# Patient Record
Sex: Male | Born: 1980 | Race: White | Hispanic: No | Marital: Single | Smoking: Current every day smoker
Health system: Southern US, Community
[De-identification: ages and names within clinical notes are randomized; demographics above are authoritative.]

## PROBLEM LIST (undated history)

## (undated) HISTORY — PX: KNEE SURGERY: SHX244

## (undated) HISTORY — PX: WISDOM TOOTH EXTRACTION: SHX21

---

## 2013-12-30 ENCOUNTER — Emergency Department: Payer: Self-pay | Admitting: Internal Medicine

## 2014-02-13 ENCOUNTER — Emergency Department: Payer: Self-pay | Admitting: Emergency Medicine

## 2014-02-13 LAB — DRUG SCREEN, URINE
Amphetamines, Ur Screen: NEGATIVE (ref ?–1000)
BARBITURATES, UR SCREEN: NEGATIVE (ref ?–200)
Benzodiazepine, Ur Scrn: POSITIVE (ref ?–200)
Cannabinoid 50 Ng, Ur ~~LOC~~: NEGATIVE (ref ?–50)
Cocaine Metabolite,Ur ~~LOC~~: NEGATIVE (ref ?–300)
MDMA (Ecstasy)Ur Screen: NEGATIVE (ref ?–500)
Methadone, Ur Screen: NEGATIVE (ref ?–300)
OPIATE, UR SCREEN: POSITIVE (ref ?–300)
PHENCYCLIDINE (PCP) UR S: NEGATIVE (ref ?–25)
TRICYCLIC, UR SCREEN: NEGATIVE (ref ?–1000)

## 2014-02-13 LAB — COMPREHENSIVE METABOLIC PANEL
ALT: 30 U/L (ref 12–78)
ANION GAP: 7 (ref 7–16)
AST: 18 U/L (ref 15–37)
Albumin: 4.4 g/dL (ref 3.4–5.0)
Alkaline Phosphatase: 54 U/L
BUN: 20 mg/dL — ABNORMAL HIGH (ref 7–18)
Bilirubin,Total: 0.6 mg/dL (ref 0.2–1.0)
CHLORIDE: 107 mmol/L (ref 98–107)
CO2: 26 mmol/L (ref 21–32)
Calcium, Total: 9.1 mg/dL (ref 8.5–10.1)
Creatinine: 0.9 mg/dL (ref 0.60–1.30)
EGFR (African American): >60
EGFR (Non-African Amer.): >60
Glucose: 91 mg/dL (ref 65–99)
Osmolality: 282 (ref 275–301)
Potassium: 3.6 mmol/L (ref 3.5–5.1)
Sodium: 140 mmol/L (ref 136–145)
TOTAL PROTEIN: 7.4 g/dL (ref 6.4–8.2)

## 2014-02-13 LAB — ETHANOL
ETHANOL %: 0.004 % (ref 0.000–0.080)
ETHANOL LVL: 4 mg/dL

## 2014-02-13 LAB — CBC
HCT: 42.2 % (ref 40.0–52.0)
HGB: 14.5 g/dL (ref 13.0–18.0)
MCH: 29.8 pg (ref 26.0–34.0)
MCHC: 34.3 g/dL (ref 32.0–36.0)
MCV: 87 fL (ref 80–100)
PLATELETS: 306 10*3/uL (ref 150–440)
RBC: 4.87 10*6/uL (ref 4.40–5.90)
RDW: 13.9 % (ref 11.5–14.5)
WBC: 11.4 10*3/uL — AB (ref 3.8–10.6)

## 2014-02-13 LAB — CK TOTAL AND CKMB (NOT AT ARMC)
CK, Total: 317 U/L — ABNORMAL HIGH
CK-MB: 2.7 ng/mL (ref 0.5–3.6)

## 2014-11-20 IMAGING — CT CT HEAD WITHOUT CONTRAST
2 series · 14 of 30 positions shown, 16 images · non-contrast
Comparison: none

[Series 2: head wo · axial · 0.42mm/px · z∈[+1160,+1264]mm · 6 of 31 slices shown, 8 images]
[im 5/31  brain]
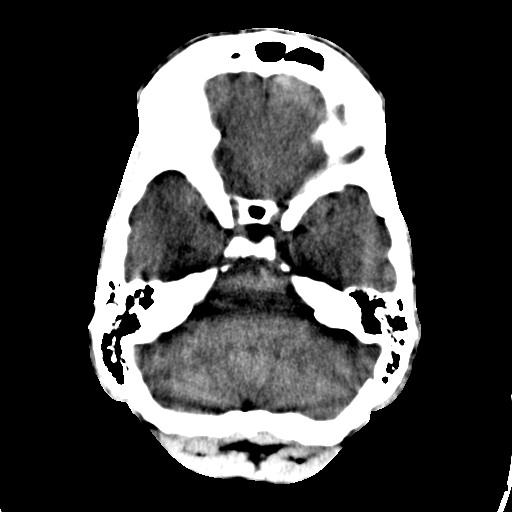
[im 5/31  bone]
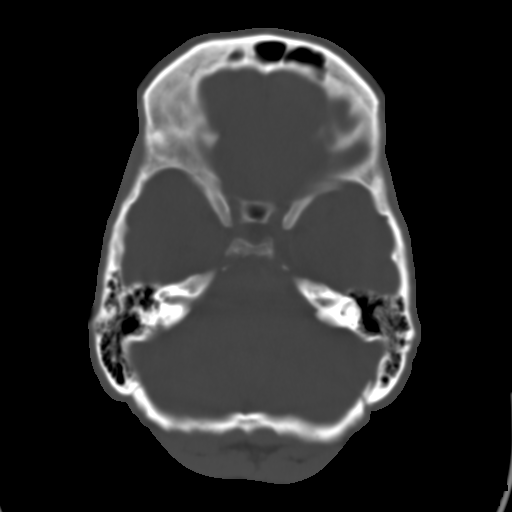
[im 9/31  brain]
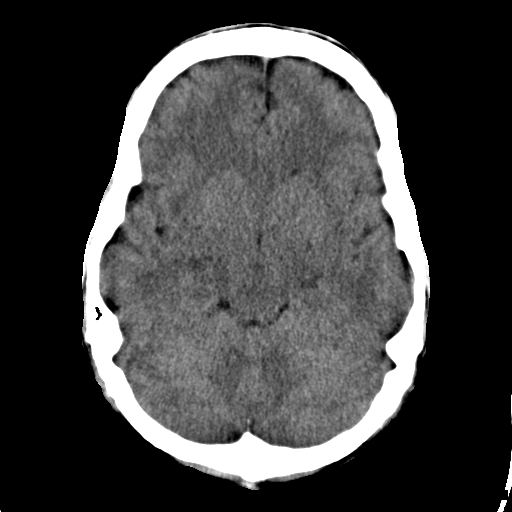
[im 13/31  brain]
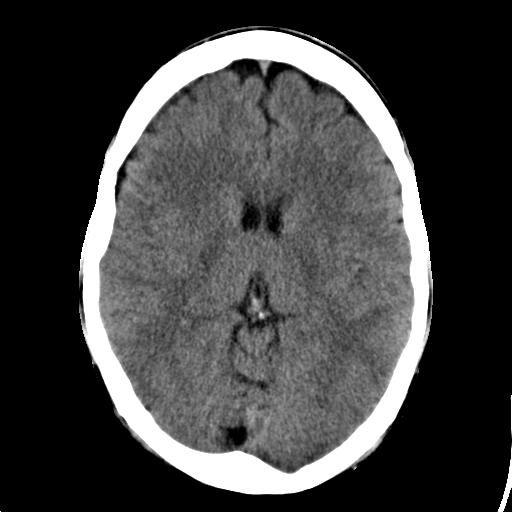
[im 18/31  brain]
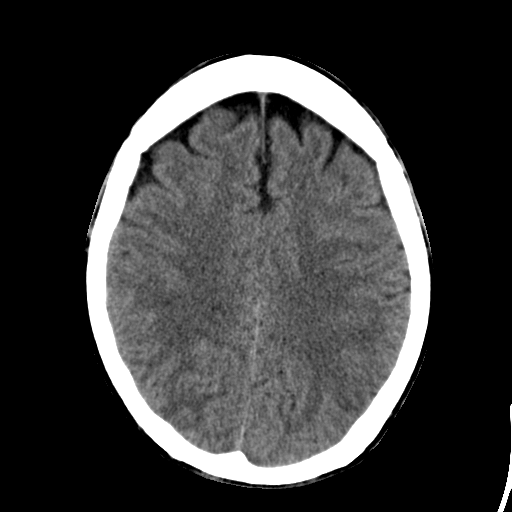
[im 22/31  brain]
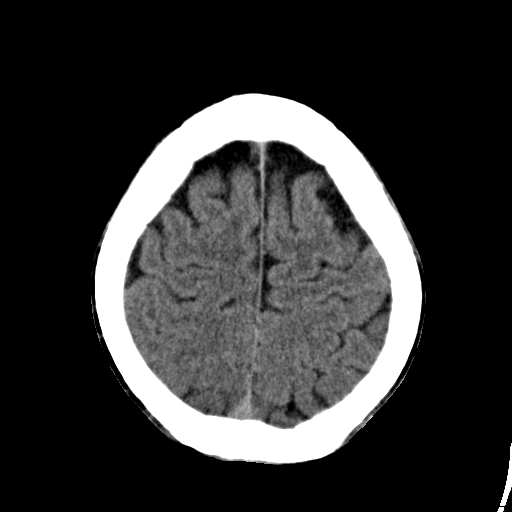
[im 22/31  bone]
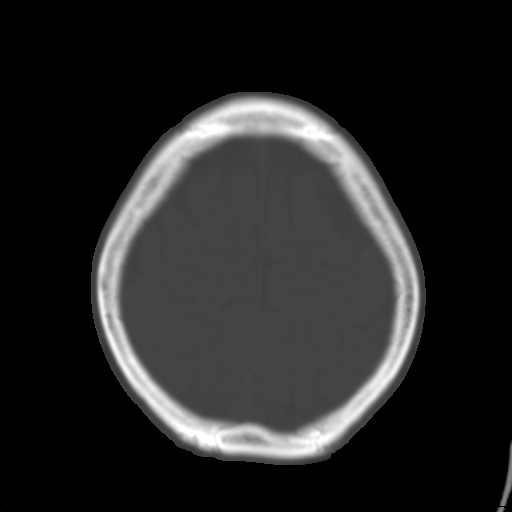
[im 26/31  brain]
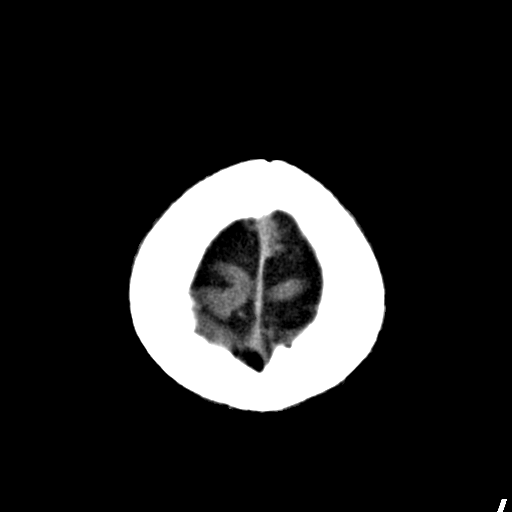

[Series 3: head bone · axial · 0.42mm/px · z∈[+1147,+1283]mm · 8 of 84 slices shown]
[im 8/84  bone]
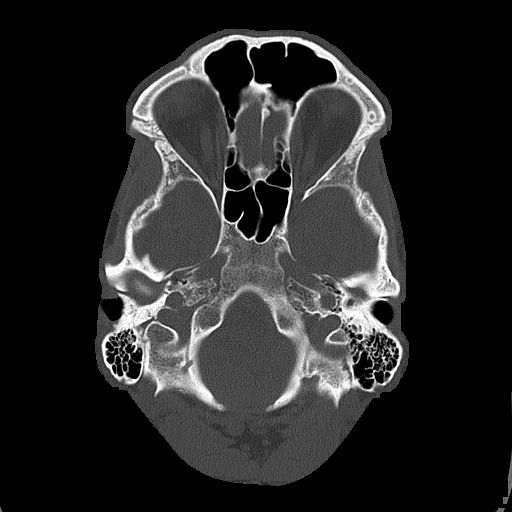
[im 16/84  bone]
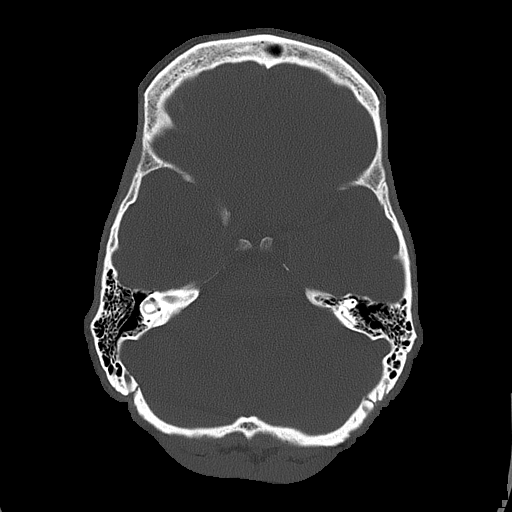
[im 28/84  bone]
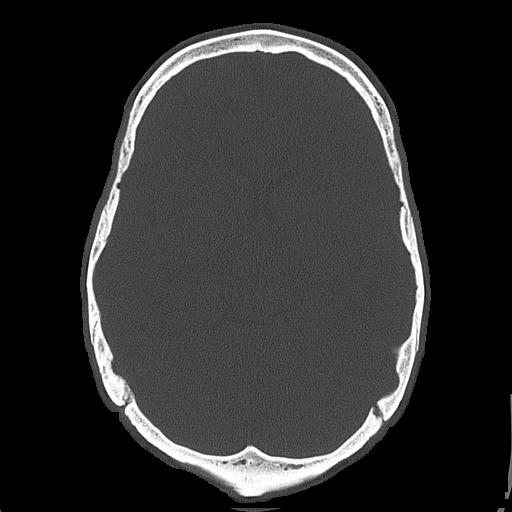
[im 36/84  bone]
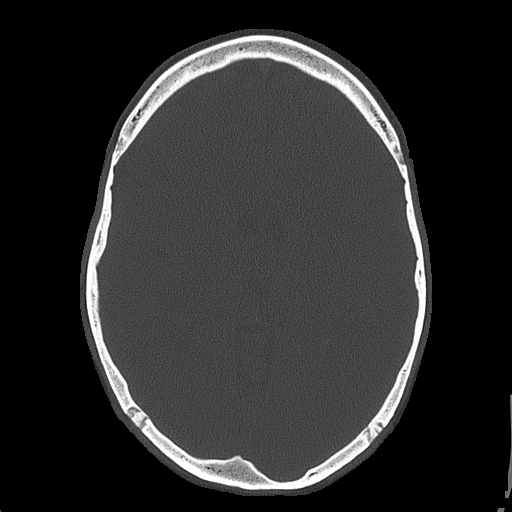
[im 48/84  bone]
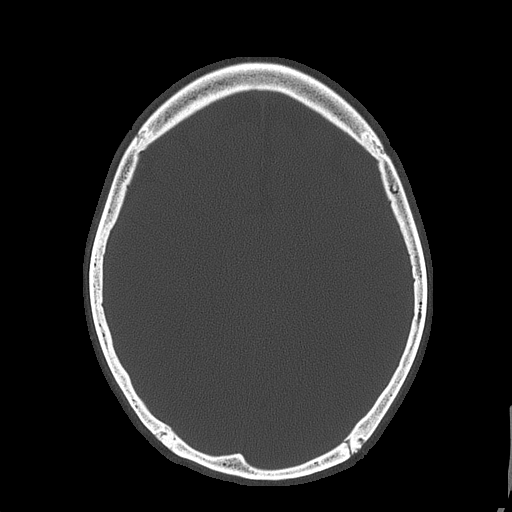
[im 56/84  bone]
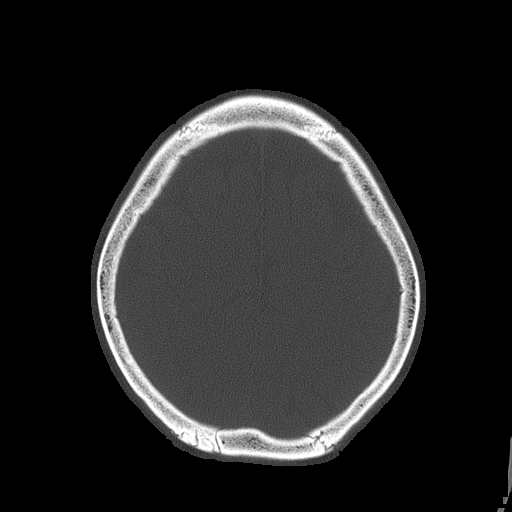
[im 68/84  bone]
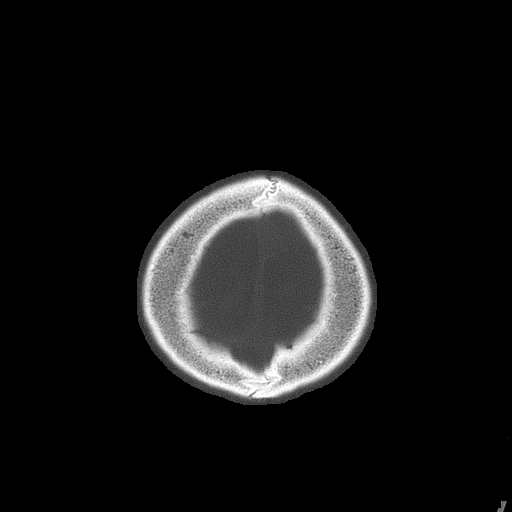
[im 76/84  bone]
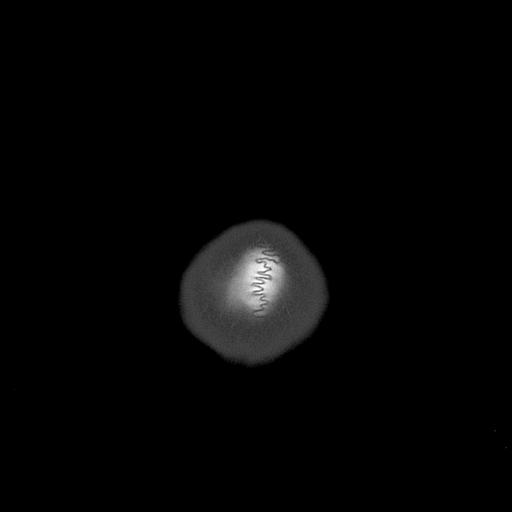

[14 of 30 positions shown; findings below may reference images not displayed]

CLINICAL DATA
Confused

EXAM
CT HEAD WITHOUT CONTRAST

TECHNIQUE
Contiguous axial images were obtained from the base of the skull
through the vertex without intravenous contrast.

COMPARISON
None.

FINDINGS
There is no evidence of mass effect, midline shift or extra-axial
fluid collections. There is no evidence of a space-occupying lesion
or intracranial hemorrhage. There is no evidence of a cortical-based
area of acute infarction.

The ventricles and sulci are appropriate for the patient's age. The
basal cisterns are patent.

Visualized portions of the orbits are unremarkable. The visualized
portions of the paranasal sinuses and mastoid air cells are
unremarkable.

The osseous structures are unremarkable.

IMPRESSION
No acute intracranial pathology.

SIGNATURE
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
## 2023-07-09 ENCOUNTER — Ambulatory Visit
Admission: EM | Admit: 2023-07-09 | Discharge: 2023-07-09 | Disposition: A | Discharge status: Home / Self Care | Payer: Medicaid Other | Attending: Physician Assistant | Admitting: Physician Assistant

## 2023-07-09 ENCOUNTER — Encounter: Payer: Self-pay | Admitting: Emergency Medicine

## 2023-07-09 DIAGNOSIS — K0889 Other specified disorders of teeth and supporting structures: Secondary | ICD-10-CM | POA: Diagnosis not present

## 2023-07-09 DIAGNOSIS — K029 Dental caries, unspecified: Secondary | ICD-10-CM | POA: Diagnosis not present

## 2023-07-09 MED ORDER — HYDROCODONE-ACETAMINOPHEN 5-325 MG PO TABS: 1.0000 | ORAL_TABLET | Freq: Two times a day (BID) | ORAL | 0 refills | Status: DC | PRN

## 2023-07-09 MED ORDER — HYDROCODONE-ACETAMINOPHEN 5-325 MG PO TABS: 1.0000 | ORAL_TABLET | Freq: Two times a day (BID) | ORAL | 0 refills | Status: AC | PRN

## 2023-07-09 MED ORDER — AMOXICILLIN-POT CLAVULANATE 875-125 MG PO TABS
1.0000 | ORAL_TABLET | Freq: Two times a day (BID) | ORAL | 0 refills | Status: AC
Start: 2023-07-09 — End: 2023-07-16

## 2023-07-09 NOTE — Discharge Instructions (Addendum)
-  I sent antibiotics to the pharmacy as well as a couple of pills for pain relief.  You should start to improve in a couple days but you will need to follow-up with dentist.

## 2023-07-09 NOTE — ED Provider Notes (Signed)
[facility]    CSN: [id number] Arrival date & time: 07/09/23  0927      History   Chief Complaint Chief Complaint  Patient presents with   Dental Pain    HPI Trevor Burgess is a 42 y.o. male presenting for dental pain of a tooth of the right upper side since yesterday, worsening today. He reports long history of poor dental hygiene and care. Does not have a dentist but says he knows he needs to have a lot of teeth pulled.  He denies any associated swelling to his face.  He has tried Tylenol without pain relief.  States he could not sleep last night due to the pain.  He cannot take NSAIDs due to his history of Crohn's and risk for GI bleed.  No other complaints.  HPI  History reviewed. No pertinent past medical history.  There are no problems to display for this patient.   Past Surgical History:  Procedure Laterality Date   KNEE SURGERY Left    WISDOM TOOTH EXTRACTION         Home Medications    Prior to Admission medications   Medication Sig Start Date End Date Taking? Authorizing Provider  amoxicillin-clavulanate (AUGMENTIN) 875-125 MG tablet Take 1 tablet by mouth every 12 (twelve) hours for 7 days. 07/09/23 07/16/23 Yes Shirlee Latch, PA-C  HYDROcodone-acetaminophen (NORCO/VICODIN) 5-325 MG tablet Take 1 tablet by mouth every 12 (twelve) hours as needed for up to 3 days for severe pain. 07/09/23 07/12/23 Yes Shirlee Latch, PA-C    Family History History reviewed. No pertinent family history.  Social History Social History   Tobacco Use   Smoking status: Every Day    Types: Cigarettes   Smokeless tobacco: Never  Vaping Use   Vaping status: Never Used  Substance Use Topics   Alcohol use: Not Currently   Drug use: Never     Allergies   Banana, Charentais melon (french melon), Ketorolac, Nsaids, and Tramadol   Review of Systems Review of Systems  Constitutional:  Negative for fatigue and fever.  HENT:  Positive for dental problem. Negative  for ear pain, facial swelling, rhinorrhea and sore throat.   Neurological:  Negative for weakness.  Hematological:  Negative for adenopathy.  Psychiatric/Behavioral:  Positive for sleep disturbance.      Physical Exam Triage Vital Signs ED Triage Vitals  Encounter Vitals Group     BP 07/09/23 0941 (!) 160/105     Systolic BP Percentile --      Diastolic BP Percentile --      Pulse Rate 07/09/23 0941 73     Resp 07/09/23 0941 15     Temp 07/09/23 0941 98.4 F (36.9 C)     Temp Source 07/09/23 0941 Oral     SpO2 07/09/23 0941 97 %     Weight 07/09/23 0937 220 lb (99.8 kg)     Height 07/09/23 0937 5\' 9"  (1.753 m)     Head Circumference --      Peak Flow --      Pain Score 07/09/23 0937 6     Pain Loc --      Pain Education --      Exclude from Growth Chart --    No data found.  Updated Vital Signs BP (!) 160/105 (BP Location: Right Arm)   Pulse 73   Temp 98.4 F (36.9 C) (Oral)   Resp 15   Ht 5\' 9"  (1.753 m)   Wt  220 lb (99.8 kg)   SpO2 97%   BMI 32.49 kg/m      Physical Exam Vitals and nursing note reviewed.  Constitutional:      General: He is not in acute distress.    Appearance: Normal appearance. He is well-developed. He is not ill-appearing.  HENT:     Head: Normocephalic and atraumatic.     Mouth/Throat:     Mouth: Mucous membranes are moist.     Dentition: Abnormal dentition. Dental tenderness and dental caries present.     Pharynx: Oropharynx is clear.      Comments: #7 right lateral incisor severely decayed with tenderness and erythema surrounding. No abscess. Eyes:     Conjunctiva/sclera: Conjunctivae normal.  Cardiovascular:     Rate and Rhythm: Normal rate.  Pulmonary:     Effort: Pulmonary effort is normal. No respiratory distress.  Musculoskeletal:     Cervical back: Neck supple.  Skin:    General: Skin is warm and dry.     Capillary Refill: Capillary refill takes less than 2 seconds.  Neurological:     General: No focal deficit  present.     Mental Status: He is alert. Mental status is at baseline.     Motor: No weakness.     Gait: Gait normal.  Psychiatric:        Mood and Affect: Mood normal.      UC Treatments / Results  Labs (all labs ordered are listed, but only abnormal results are displayed) Labs Reviewed - No data to display  EKG   Radiology No results found.  Procedures Procedures (including critical care time)  Medications Ordered in UC Medications - No data to display  Initial Impression / Assessment and Plan / UC Course  I have reviewed the triage vital signs and the nursing notes.  Pertinent labs & imaging results that were available during my care of the patient were reviewed by me and considered in my medical decision making (see chart for details).   42 year old male presents for pain of the right lateral incisor since yesterday.  Patient reports a long history of poor dental hygiene and care on his part.  Does not have a dentist.  Has been taking Tylenol without relief.  On exam he has significant dental caries and dental decay throughout all teeth.  The right lateral incisor is severely decayed with tenderness and surrounding erythema without abscess.  Will treat at this time for infection with Augmentin.  Provided him with  #6 tablets hydrocodone as needed for severe pain.  Advised him to follow-up with dentistry.  Supportive care.  Follow-up here as needed.   Final Clinical Impressions(s) / UC Diagnoses   Final diagnoses:  Pain, dental  Dental caries     Discharge Instructions      -I sent antibiotics to the pharmacy as well as a couple of pills for pain relief.  You should start to improve in a couple days but you will need to follow-up with dentist.     ED Prescriptions     Medication Sig Dispense Auth. Provider   amoxicillin-clavulanate (AUGMENTIN) 875-125 MG tablet Take 1 tablet by mouth every 12 (twelve) hours for 7 days. 14 tablet Eusebio Friendly B, PA-C    HYDROcodone-acetaminophen (NORCO/VICODIN) 5-325 MG tablet Take 1 tablet by mouth every 12 (twelve) hours as needed for up to 3 days for severe pain. 6 tablet Shirlee Latch, PA-C      I have reviewed the PDMP during  this encounter.   Shirlee Latch, PA-C 07/09/23 1024

## 2023-07-09 NOTE — ED Triage Notes (Signed)
Patient c/o right upper tooth pain that started yesterday and radiates to his right facial cheek area. Patient denies recent injury.  Patient denies fevers.

## 2023-07-15 ENCOUNTER — Other Ambulatory Visit: Payer: Self-pay

## 2023-07-15 ENCOUNTER — Emergency Department
Admission: EM | Admit: 2023-07-15 | Discharge: 2023-07-15 | Disposition: A | Discharge status: Home / Self Care | Payer: Medicaid Other | Source: Home / Self Care | Attending: Emergency Medicine | Admitting: Emergency Medicine

## 2023-07-15 DIAGNOSIS — K0889 Other specified disorders of teeth and supporting structures: Secondary | ICD-10-CM

## 2023-07-15 DIAGNOSIS — Z98818 Other dental procedure status: Secondary | ICD-10-CM | POA: Insufficient documentation

## 2023-07-15 DIAGNOSIS — F17210 Nicotine dependence, cigarettes, uncomplicated: Secondary | ICD-10-CM | POA: Diagnosis not present

## 2023-07-15 DIAGNOSIS — K08409 Partial loss of teeth, unspecified cause, unspecified class: Secondary | ICD-10-CM

## 2023-07-15 MED ORDER — LIDOCAINE VISCOUS HCL 2 % MT SOLN
15.0000 mL | Freq: Once | OROMUCOSAL | Status: AC
  Administered 2023-07-15: 15 mL via OROMUCOSAL
  Filled 2023-07-15: qty 15

## 2023-07-15 MED ORDER — OXYCODONE-ACETAMINOPHEN 5-325 MG PO TABS
1.0000 | ORAL_TABLET | Freq: Once | ORAL | Status: AC
  Administered 2023-07-15: 1 via ORAL
  Filled 2023-07-15: qty 1

## 2023-07-15 MED ORDER — OXYCODONE-ACETAMINOPHEN 5-325 MG PO TABS: 1.0000 | ORAL_TABLET | ORAL | 0 refills | Status: AC | PRN

## 2023-07-15 NOTE — Discharge Instructions (Signed)
You may take Percocet as needed for pain.  Return to the ER for worsening symptoms, persistent vomiting, facial swelling, fever or other concerns.

## 2023-07-15 NOTE — ED Triage Notes (Signed)
Pt reports he had 2 teeth pulled on the R side on Tuesday. Reports tonight had onset of dental pain and states "my mouth is on fire." Pt is ambulatory to triage. Alert and oriented following commands. Breathing unlabored speaking in full sentences. Symmetric chest rise and fall.

## 2023-07-15 NOTE — ED Provider Notes (Signed)
[facility] Provider Note    Event Date/Time   First MD Initiated Contact with Patient 07/15/23 [phone]     (approximate)   History   Dental Pain   HPI  Trevor Burgess is a 42 y.o. male who presents to the ED from home with a chief complaint of dental pain.  Patient had 2 teeth extracted 2 days ago and reports pain worsened today.  States it feels like his mouth is on fire particularly after he smoked a cigarette.  Denies fever/chills, facial swelling, nausea/vomiting or dizziness.     Past Medical History  History reviewed. No pertinent past medical history.   Active Problem List  There are no problems to display for this patient.    Past Surgical History   Past Surgical History:  Procedure Laterality Date   KNEE SURGERY Left    WISDOM TOOTH EXTRACTION       Home Medications   Prior to Admission medications   Medication Sig Start Date End Date Taking? Authorizing Provider  oxyCODONE-acetaminophen (PERCOCET/ROXICET) 5-325 MG tablet Take 1 tablet by mouth every 4 (four) hours as needed for severe pain. 07/15/23  Yes Irean Hong, MD  amoxicillin-clavulanate (AUGMENTIN) 875-125 MG tablet Take 1 tablet by mouth every 12 (twelve) hours for 7 days. 07/09/23 07/16/23  Shirlee Latch, PA-C     Allergies  Banana, Charentais melon (french melon), Ketorolac, Nsaids, and Tramadol   Family History  History reviewed. No pertinent family history.   Physical Exam  Triage Vital Signs: ED Triage Vitals  Encounter Vitals Group     BP 07/15/23 0043 (!) 158/118     Systolic BP Percentile --      Diastolic BP Percentile --      Pulse Rate 07/15/23 0042 77     Resp 07/15/23 0042 20     Temp 07/15/23 0042 98.5 F (36.9 C)     Temp Source 07/15/23 0042 Oral     SpO2 07/15/23 0042 98 %     Weight 07/15/23 0042 215 lb (97.5 kg)     Height 07/15/23 0042 5\' 9"  (1.753 m)     Head Circumference --      Peak Flow --      Pain Score 07/15/23 0042 10     Pain  Loc --      Pain Education --      Exclude from Growth Chart --     Updated Vital Signs: BP (!) 158/118   Pulse 77   Temp 98.5 F (36.9 C) (Oral)   Resp 20   Ht 5\' 9"  (1.753 m)   Wt 97.5 kg   SpO2 98%   BMI 31.75 kg/m    General: Awake, mild distress.  CV:  Good peripheral perfusion.  Resp:  Normal effort. Abd:  No distention.  Other:  Widespread dental caries and poor dentition.  Right upper incisor status post extraction; no dry pocket noted.  No intra or extraoral swelling.   ED Results / Procedures / Treatments  Labs (all labs ordered are listed, but only abnormal results are displayed) Labs Reviewed - No data to display   EKG  None   RADIOLOGY None   Official radiology report(s): No results found.   PROCEDURES:  Critical Care performed: No  Procedures   MEDICATIONS ORDERED IN ED: Medications  lidocaine (XYLOCAINE) 2 % viscous mouth solution 15 mL (has no administration in time range)  oxyCODONE-acetaminophen (PERCOCET/ROXICET) 5-325 MG per tablet 1 tablet (  has no administration in time range)     IMPRESSION / MDM / ASSESSMENT AND PLAN / ED COURSE  I reviewed the triage vital signs and the nursing notes.                             42 year old male presenting with post dental extraction pain.  Administer lidocaine rinse, Percocet and he will call his dentist in the morning for follow-up.  Strict return precautions given.  Patient verbalizes understanding and agrees with plan of care.  Patient's presentation is most consistent with acute, uncomplicated illness.   FINAL CLINICAL IMPRESSION(S) / ED DIAGNOSES   Final diagnoses:  Pain, dental  Status post tooth extraction     Rx / DC Orders   ED Discharge Orders          Ordered    oxyCODONE-acetaminophen (PERCOCET/ROXICET) 5-325 MG tablet  Every 4 hours PRN        07/15/23 0059             Note:  This document was prepared using Dragon voice recognition software and may include  unintentional dictation errors.   Irean Hong, MD 07/15/23 [phone]

## 2023-07-19 ENCOUNTER — Other Ambulatory Visit: Payer: Self-pay

## 2023-07-19 ENCOUNTER — Emergency Department
Admission: EM | Admit: 2023-07-19 | Discharge: 2023-07-19 | Disposition: A | Discharge status: Home / Self Care | Payer: Medicaid Other | Attending: Emergency Medicine | Admitting: Emergency Medicine

## 2023-07-19 DIAGNOSIS — F410 Panic disorder [episodic paroxysmal anxiety] without agoraphobia: Secondary | ICD-10-CM | POA: Insufficient documentation

## 2023-07-19 MED ORDER — LORAZEPAM 1 MG PO TABS: 1.0000 mg | ORAL_TABLET | Freq: Two times a day (BID) | ORAL | 0 refills | Status: AC

## 2023-07-19 MED ORDER — LORAZEPAM 1 MG PO TABS
1.0000 mg | ORAL_TABLET | Freq: Once | ORAL | Status: AC
  Administered 2023-07-19: 1 mg via ORAL
  Filled 2023-07-19: qty 1

## 2023-07-19 NOTE — ED Notes (Signed)
Pt verbalizes understanding of discharge instructions. Opportunity for questioning and answers were provided. Pt discharged from ED to home in [place].
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
## 2023-07-19 NOTE — ED Notes (Signed)
Pt reports anxiety.  Denies SI or HI
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
## 2023-07-20 DIAGNOSIS — K0889 Other specified disorders of teeth and supporting structures: Secondary | ICD-10-CM | POA: Diagnosis present

## 2023-07-20 DIAGNOSIS — K029 Dental caries, unspecified: Secondary | ICD-10-CM | POA: Insufficient documentation

## 2023-07-20 DIAGNOSIS — K047 Periapical abscess without sinus: Secondary | ICD-10-CM | POA: Diagnosis not present

## 2023-07-21 ENCOUNTER — Emergency Department
Admission: EM | Admit: 2023-07-21 | Discharge: 2023-07-21 | Disposition: A | Discharge status: Home / Self Care | Payer: Medicaid Other | Attending: Emergency Medicine | Admitting: Emergency Medicine

## 2023-07-21 ENCOUNTER — Other Ambulatory Visit: Payer: Self-pay

## 2023-07-21 DIAGNOSIS — K0889 Other specified disorders of teeth and supporting structures: Secondary | ICD-10-CM

## 2023-07-21 DIAGNOSIS — K047 Periapical abscess without sinus: Secondary | ICD-10-CM

## 2023-07-21 DIAGNOSIS — K029 Dental caries, unspecified: Secondary | ICD-10-CM

## 2023-07-21 MED ORDER — AMOXICILLIN 500 MG PO TABS: 500.0000 mg | ORAL_TABLET | Freq: Two times a day (BID) | ORAL | 0 refills | Status: AC

## 2023-07-21 MED ORDER — OXYCODONE HCL 5 MG PO TABS: 5.0000 mg | ORAL_TABLET | Freq: Three times a day (TID) | ORAL | 0 refills | Status: DC | PRN

## 2023-07-21 MED ORDER — ACETAMINOPHEN 500 MG PO TABS
1000.0000 mg | ORAL_TABLET | Freq: Once | ORAL | Status: AC
  Administered 2023-07-21: 1000 mg via ORAL
  Filled 2023-07-21: qty 2

## 2023-07-21 MED ORDER — AMOXICILLIN 500 MG PO CAPS
500.0000 mg | ORAL_CAPSULE | Freq: Once | ORAL | Status: AC
  Administered 2023-07-21: 500 mg via ORAL
  Filled 2023-07-21: qty 1

## 2023-07-21 MED ORDER — OXYCODONE HCL 5 MG PO TABS
5.0000 mg | ORAL_TABLET | Freq: Once | ORAL | Status: AC
  Administered 2023-07-21: 5 mg via ORAL
  Filled 2023-07-21: qty 1

## 2023-07-21 MED ORDER — ONDANSETRON 4 MG PO TBDP
4.0000 mg | ORAL_TABLET | Freq: Once | ORAL | Status: AC
  Administered 2023-07-21: 4 mg via ORAL
  Filled 2023-07-21: qty 1

## 2023-07-21 NOTE — ED Triage Notes (Signed)
Reports dental pain that onset today. Dental caries and broken teeth noted. Pt awaiting full mouth extraction. Reports pain is L lower and states he can feel the tooth moving and that he found pieces of teeth on his gumline. Pt ambulatory to triage. Alert and oriented following commands. Breathing unlabored speaking in full sentences.

## 2023-07-21 NOTE — ED Provider Notes (Signed)
[facility] Provider Note    Event Date/Time   First MD Initiated Contact with Patient 07/21/23 0240     (approximate)   History   Dental Pain   HPI  Trevor Burgess is a 42 y.o. male who presents to the ED for evaluation of Dental Pain   Patient presents with acute on chronic dental pain.  Reports he needs all of his teeth pulled.  Reports pain to the left mandibular molar without swelling or fevers   Physical Exam   Triage Vital Signs: ED Triage Vitals  Encounter Vitals Group     BP 07/21/23 0013 (!) 172/126     Systolic BP Percentile --      Diastolic BP Percentile --      Pulse Rate 07/21/23 0013 98     Resp 07/21/23 0013 18     Temp 07/21/23 0013 98.4 F (36.9 C)     Temp Source 07/21/23 0013 Oral     SpO2 07/21/23 0013 97 %     Weight 07/21/23 0014 213 lb (96.6 kg)     Height 07/21/23 0014 5\' 9"  (1.753 m)     Head Circumference --      Peak Flow --      Pain Score 07/21/23 0013 8     Pain Loc --      Pain Education --      Exclude from Growth Chart --     Most recent vital signs: Vitals:   07/21/23 0013 07/21/23 0415  BP: (!) 172/126 (!) 152/90  Pulse: 98 90  Resp: 18 16  Temp: 98.4 F (36.9 C)   SpO2: 97% 97%    General: Awake, no distress.  CV:  Good peripheral perfusion.  Resp:  Normal effort.  Abd:  No distention.  MSK:  No deformity noted.  Neuro:  No focal deficits appreciated. Other:  Poor dentition throughout.  No evidence of abscess.   ED Results / Procedures / Treatments   Labs (all labs ordered are listed, but only abnormal results are displayed) Labs Reviewed - No data to display  EKG   RADIOLOGY   Official radiology report(s): No results found.  PROCEDURES and INTERVENTIONS:  Procedures  Medications  ondansetron (ZOFRAN-ODT) disintegrating tablet 4 mg (4 mg Oral Given 07/21/23 0019)  oxyCODONE (Oxy IR/ROXICODONE) immediate release tablet 5 mg (5 mg Oral Given 07/21/23 0412)  acetaminophen  (TYLENOL) tablet 1,000 mg (1,000 mg Oral Given 07/21/23 0412)  amoxicillin (AMOXIL) capsule 500 mg (500 mg Oral Given 07/21/23 0413)     IMPRESSION / MDM / ASSESSMENT AND PLAN / ED COURSE  I reviewed the triage vital signs and the nursing notes.  Differential diagnosis includes, but is not limited to, abscess, dental infection, fractured tooth  Patient presents with acute on chronic dental pain suitable for outpatient management with dental follow-up.  No signs of upper airway obstruction or abscess.  We will cover for infection with a few days of antibiotics.  Provided information for dental follow-up.  Discussed return precautions.       FINAL CLINICAL IMPRESSION(S) / ED DIAGNOSES   Final diagnoses:  Pain, dental  Dental caries  Dental infection  Pain due to dental caries     Rx / DC Orders   ED Discharge Orders          Ordered    amoxicillin (AMOXIL) 500 MG tablet  2 times daily        07/21/23 0405  oxyCODONE (ROXICODONE) 5 MG immediate release tablet  Every 8 hours PRN        07/21/23 0405             Note:  This document was prepared using Dragon voice recognition software and may include unintentional dictation errors.   Delton Prairie, MD 07/21/23 [phone]

## 2023-07-21 NOTE — ED Triage Notes (Signed)
Pt reports not supposed to have NSAIDS. States took 4 BC powders pta without relief. Pt now nauseous and vomiting in triage.

## 2023-07-21 NOTE — ED Notes (Signed)
Report from Frederika, rn

## 2023-07-21 NOTE — Discharge Instructions (Addendum)
Use Tylenol for pain and fevers.  Up to 1000 mg per dose, up to 4 times per day.  Do not take more than 4000 mg of Tylenol/acetaminophen within 24 hours..  Amoxicillin antibiotics twice daily for a week to treat possible infection

## 2023-07-22 ENCOUNTER — Telehealth: Payer: Self-pay

## 2023-07-22 NOTE — Telephone Encounter (Signed)
Per Dr. Elna Breslow, pt had recent visit to ED , I do not recommend him waiting to see me.  Patient needs to be scheduled with some one who can see him sooner.  Also with his needs for lorazepam,please let him know controlled substance policy.  Please refer to someone else.   Tc on 07-22-23 @ 11:12 left message explaining.  Pt was also left information for [facility]. And was advised to contact insurance company to find in network providers in the area.

## 2023-07-22 NOTE — Telephone Encounter (Signed)
-----   Message from Jomarie Longs sent at 07/22/2023  8:27 AM EDT ----- Regarding: RE: referral review Patient with recent visit to ED , I do not recommend him waiting to see me. Patient needs to be scheduled with some one who can see him sooner. Also with his needs for lorazepam,please let him know controlled substance policy. Please refer to someone else. ----- Message ----- From: Elvina Mattes, CMA Sent: 07/21/2023  11:26 AM EDT To: Jomarie Longs, MD Subject: referral review                                Please review referral

## 2023-08-17 ENCOUNTER — Other Ambulatory Visit: Payer: Self-pay

## 2023-08-17 DIAGNOSIS — K0889 Other specified disorders of teeth and supporting structures: Secondary | ICD-10-CM | POA: Insufficient documentation

## 2023-08-17 NOTE — ED Triage Notes (Signed)
Pt to ED via POV c/o dental pain. Pt says he had 9 teeth pulled today. Pt still having pain on left side. Pt took tylenol with no relief.

## 2023-08-18 ENCOUNTER — Emergency Department
Admission: EM | Admit: 2023-08-18 | Discharge: 2023-08-18 | Disposition: A | Discharge status: Home / Self Care | Payer: Medicaid Other | Attending: Emergency Medicine | Admitting: Emergency Medicine

## 2023-08-18 DIAGNOSIS — K0889 Other specified disorders of teeth and supporting structures: Secondary | ICD-10-CM

## 2023-08-18 MED ORDER — OXYCODONE HCL 5 MG PO TABS
5.0000 mg | ORAL_TABLET | Freq: Once | ORAL | Status: AC
  Administered 2023-08-18: 5 mg via ORAL
  Filled 2023-08-18: qty 1

## 2023-08-18 MED ORDER — OXYCODONE HCL 5 MG PO TABS: 5.0000 mg | ORAL_TABLET | Freq: Three times a day (TID) | ORAL | 0 refills | Status: DC | PRN

## 2023-08-18 MED ORDER — ACETAMINOPHEN 500 MG PO TABS
1000.0000 mg | ORAL_TABLET | Freq: Once | ORAL | Status: AC
  Administered 2023-08-18: 1000 mg via ORAL
  Filled 2023-08-18: qty 2

## 2023-08-18 MED ORDER — KETOROLAC TROMETHAMINE 30 MG/ML IJ SOLN
30.0000 mg | Freq: Once | INTRAMUSCULAR | Status: AC
  Administered 2023-08-18: 30 mg via INTRAMUSCULAR
  Filled 2023-08-18: qty 1

## 2023-08-18 NOTE — Discharge Instructions (Addendum)
Please take Tylenol and ibuprofen/Advil for your pain.  It is safe to take them together, or to alternate them every few hours.  Take up to 1000mg  of Tylenol at a time, up to 4 times per day.  Do not take more than 4000 mg of Tylenol in 24 hours.  For ibuprofen, take 400-600 mg, 3 - 4 times per day.  Oxycodone as needed for more severe / breakthrough pain

## 2023-08-18 NOTE — ED Provider Notes (Signed)
[facility] Provider Note    Event Date/Time   First MD Initiated Contact with Patient 08/18/23 0021     (approximate)   History   Dental Pain   HPI  Trevor Burgess is a 42 y.o. male who presents to the ED for evaluation of Dental Pain   Patient presents with maxillary dental pain after an extraction performed today.  Reports 9 maxillary teeth pulled and he did not receive any pain medications.  Has taken Tylenol without significant relief.  No fevers, swelling, difficulty breathing or swallowing   Physical Exam   Triage Vital Signs: ED Triage Vitals  Encounter Vitals Group     BP 08/17/23 2233 131/89     Systolic BP Percentile --      Diastolic BP Percentile --      Pulse Rate 08/17/23 2233 92     Resp 08/17/23 2233 20     Temp 08/17/23 2233 98.9 F (37.2 C)     Temp Source 08/17/23 2233 Oral     SpO2 08/17/23 2233 98 %     Weight 08/17/23 2231 215 lb (97.5 kg)     Height 08/17/23 2231 5\' 9"  (1.753 m)     Head Circumference --      Peak Flow --      Pain Score 08/17/23 2230 10     Pain Loc --      Pain Education --      Exclude from Growth Chart --     Most recent vital signs: Vitals:   08/17/23 2233  BP: 131/89  Pulse: 92  Resp: 20  Temp: 98.9 F (37.2 C)  SpO2: 98%    General: Awake, no distress.  CV:  Good peripheral perfusion.  Resp:  Normal effort.  Abd:  No distention.  MSK:  No deformity noted.  Neuro:  No focal deficits appreciated. Other:  Very poor dentition, multiple areas of sutures of extraction sites to maxillary gumline   ED Results / Procedures / Treatments   Labs (all labs ordered are listed, but only abnormal results are displayed) Labs Reviewed - No data to display  EKG   RADIOLOGY   Official radiology report(s): No results found.  PROCEDURES and INTERVENTIONS:  Procedures  Medications  acetaminophen (TYLENOL) tablet 1,000 mg (has no administration in time range)  ketorolac (TORADOL) 30  MG/ML injection 30 mg (has no administration in time range)  oxyCODONE (Oxy IR/ROXICODONE) immediate release tablet 5 mg (has no administration in time range)     IMPRESSION / MDM / ASSESSMENT AND PLAN / ED COURSE  I reviewed the triage vital signs and the nursing notes.  Differential diagnosis includes, but is not limited to, postoperative pain, infection, abscess, upper airway obstruction  Patient presents with postop pain after multiple tooth extractions.  Appropriate exam.  Suitable for outpatient management.  Will provide analgesia here and a few doses of oxycodone.  Provided dental resources for follow-up     FINAL CLINICAL IMPRESSION(S) / ED DIAGNOSES   Final diagnoses:  Pain, dental     Rx / DC Orders   ED Discharge Orders          Ordered    oxyCODONE (ROXICODONE) 5 MG immediate release tablet  Every 8 hours PRN        08/18/23 0027             Note:  This document was prepared using Dragon voice recognition software and may include unintentional dictation  errors.   Delton Prairie, MD 08/18/23 [phone]

## 2023-09-18 ENCOUNTER — Emergency Department
Admission: EM | Admit: 2023-09-18 | Discharge: 2023-09-19 | Disposition: A | Discharge status: Home / Self Care | Payer: Medicaid Other | Attending: Emergency Medicine | Admitting: Emergency Medicine

## 2023-09-18 ENCOUNTER — Other Ambulatory Visit: Payer: Self-pay

## 2023-09-18 DIAGNOSIS — K0381 Cracked tooth: Secondary | ICD-10-CM | POA: Insufficient documentation

## 2023-09-18 DIAGNOSIS — K029 Dental caries, unspecified: Secondary | ICD-10-CM | POA: Insufficient documentation

## 2023-09-18 MED ORDER — LIDOCAINE VISCOUS HCL 2 % MT SOLN
15.0000 mL | Freq: Once | OROMUCOSAL | Status: AC
  Administered 2023-09-19: 15 mL via OROMUCOSAL
  Filled 2023-09-18: qty 15

## 2023-09-18 MED ORDER — AMOXICILLIN 500 MG PO CAPS
500.0000 mg | ORAL_CAPSULE | Freq: Once | ORAL | Status: AC
  Administered 2023-09-19: 500 mg via ORAL
  Filled 2023-09-18: qty 1

## 2023-09-18 MED ORDER — HYDROCODONE-ACETAMINOPHEN 5-325 MG PO TABS: 1.0000 | ORAL_TABLET | Freq: Four times a day (QID) | ORAL | 0 refills | Status: AC | PRN

## 2023-09-18 MED ORDER — HYDROCODONE-ACETAMINOPHEN 5-325 MG PO TABS
1.0000 | ORAL_TABLET | Freq: Once | ORAL | Status: AC
  Administered 2023-09-19: 1 via ORAL
  Filled 2023-09-18: qty 1

## 2023-09-18 MED ORDER — AMOXICILLIN 500 MG PO CAPS: 500.0000 mg | ORAL_CAPSULE | Freq: Three times a day (TID) | ORAL | 0 refills | Status: DC

## 2023-09-18 NOTE — ED Provider Notes (Incomplete)
[facility] Provider Note    Event Date/Time   First MD Initiated Contact with Patient 09/18/23 2353     (approximate)   History   Dental Pain   HPI {Remember to add pertinent medical, surgical, social, and/or OB history to HPI:1} Trevor Burgess is a 42 y.o. male  ***       Past Medical History  No past medical history on file.   Active Problem List  There are no problems to display for this patient.    Past Surgical History   Past Surgical History:  Procedure Laterality Date  . KNEE SURGERY Left   . WISDOM TOOTH EXTRACTION       Home Medications   Prior to Admission medications   Medication Sig Start Date End Date Taking? Authorizing Provider  amoxicillin (AMOXIL) 500 MG capsule Take 1 capsule (500 mg total) by mouth 3 (three) times daily. 09/18/23  Yes Irean Hong, MD  HYDROcodone-acetaminophen (NORCO) 5-325 MG tablet Take 1 tablet by mouth every 6 (six) hours as needed for moderate pain. 09/18/23  Yes Irean Hong, MD  oxyCODONE (ROXICODONE) 5 MG immediate release tablet Take 1 tablet (5 mg total) by mouth every 8 (eight) hours as needed. 07/21/23 07/20/24  Delton Prairie, MD  oxyCODONE (ROXICODONE) 5 MG immediate release tablet Take 1 tablet (5 mg total) by mouth every 8 (eight) hours as needed. 08/18/23 08/17/24  Delton Prairie, MD  oxyCODONE-acetaminophen (PERCOCET/ROXICET) 5-325 MG tablet Take 1 tablet by mouth every 4 (four) hours as needed for severe pain. 07/15/23   Irean Hong, MD     Allergies  Banana, Charentais melon (french melon), Ketorolac, Nsaids, and Tramadol   Family History  No family history on file.   Physical Exam  Triage Vital Signs: ED Triage Vitals  Encounter Vitals Group     BP 09/18/23 2157 (!) 130/113     Systolic BP Percentile --      Diastolic BP Percentile --      Pulse Rate 09/18/23 2157 92     Resp 09/18/23 2157 18     Temp 09/18/23 2157 98.4 F (36.9 C)     Temp src --      SpO2 09/18/23 2157  97 %     Weight 09/18/23 2158 210 lb (95.3 kg)     Height 09/18/23 2158 5\' 9"  (1.753 m)     Head Circumference --      Peak Flow --      Pain Score 09/18/23 2158 10     Pain Loc --      Pain Education --      Exclude from Growth Chart --     Updated Vital Signs: BP (!) 130/113 (BP Location: Left Arm)   Pulse 92   Temp 98.4 F (36.9 C)   Resp 18   Ht 5\' 9"  (1.753 m)   Wt 95.3 kg   SpO2 97%   BMI 31.01 kg/m   {Only need to document appropriate and relevant physical exam:1} General: Awake, no distress. *** CV:  Good peripheral perfusion. *** Resp:  Normal effort. *** Abd:  No distention. *** Other:  ***   ED Results / Procedures / Treatments  Labs (all labs ordered are listed, but only abnormal results are displayed) Labs Reviewed - No data to display   EKG  ***   RADIOLOGY *** {You MUST document your own interpretation of imaging, as well as the fact that you reviewed the  radiologist's report!:1}  Official radiology report(s): No results found.   PROCEDURES:  Critical Care performed: {CriticalCareYesNo:19197::"Yes, see critical care procedure note(s)","No"}  Procedures   MEDICATIONS ORDERED IN ED: Medications  amoxicillin (AMOXIL) capsule 500 mg (has no administration in time range)  lidocaine (XYLOCAINE) 2 % viscous mouth solution 15 mL (has no administration in time range)  HYDROcodone-acetaminophen (NORCO/VICODIN) 5-325 MG per tablet 1 tablet (has no administration in time range)     IMPRESSION / MDM / ASSESSMENT AND PLAN / ED COURSE  I reviewed the triage vital signs and the nursing notes.                              Differential diagnosis includes, but is not limited to, ***  Patient's presentation is most consistent with {EM COPA:27473}  {If the patient is on the monitor, remove the brackets and asterisks on the sentence below and remember to document it as a Procedure as well. Otherwise delete the sentence below:1} {**The patient is on  the cardiac monitor to evaluate for evidence of arrhythmia and/or significant heart rate changes.**}  {Remember to include, when applicable, any/all of the following data: independent review of imaging independent review of labs (comment specifically on pertinent positives and negatives) review of specific prior hospitalizations, PCP/specialist notes, etc. discuss meds given and prescribed document any discussion with consultants (including hospitalists) any clinical decision tools you used and why (PECARN, NEXUS, etc.) did you consider admitting the patient? document social determinants of health affecting patient's care (homelessness, inability to follow up in a timely fashion, etc) document any pre-existing conditions increasing risk on current visit (e.g. diabetes and HTN increasing danger of high-risk chest pain/ACS) describes what meds you gave (especially parenteral) and why any other interventions?:1}      FINAL CLINICAL IMPRESSION(S) / ED DIAGNOSES   Final diagnoses:  Pain due to dental caries     Rx / DC Orders   ED Discharge Orders          Ordered    amoxicillin (AMOXIL) 500 MG capsule  3 times daily        09/18/23 2358    HYDROcodone-acetaminophen (NORCO) 5-325 MG tablet  Every 6 hours PRN        09/18/23 2358             Note:  This document was prepared using Dragon voice recognition software and may include unintentional dictation errors.

## 2023-09-18 NOTE — ED Triage Notes (Signed)
Pt sts that he is suppose to get the bottom 13 teeth removed on the 10/28 of this month however he thinks that they are infected as he is having severe pain at this time.

## 2023-09-18 NOTE — ED Provider Notes (Signed)
[facility] Provider Note    Event Date/Time   First MD Initiated Contact with Patient 09/18/23 2353     (approximate)   History   Dental Pain   HPI  Trevor Burgess is a 42 y.o. male who presents to the ED from home with a chief complaint of dentalgia.  Patient has an appointment with oral maxillofacial surgeon at the end of this month to remove his entire bottom row of decayed teeth.  Complains of pain and is concerned he needs antibiotics.  Denies fever/chills, facial swelling, nausea/vomiting or dizziness.     Past Medical History  No past medical history on file.   Active Problem List  There are no problems to display for this patient.    Past Surgical History   Past Surgical History:  Procedure Laterality Date   KNEE SURGERY Left    WISDOM TOOTH EXTRACTION       Home Medications   Prior to Admission medications   Medication Sig Start Date End Date Taking? Authorizing Provider  amoxicillin (AMOXIL) 500 MG capsule Take 1 capsule (500 mg total) by mouth 3 (three) times daily. 09/18/23  Yes Irean Hong, MD  HYDROcodone-acetaminophen (NORCO) 5-325 MG tablet Take 1 tablet by mouth every 6 (six) hours as needed for moderate pain. 09/18/23  Yes Irean Hong, MD  oxyCODONE (ROXICODONE) 5 MG immediate release tablet Take 1 tablet (5 mg total) by mouth every 8 (eight) hours as needed. 07/21/23 07/20/24  Delton Prairie, MD  oxyCODONE (ROXICODONE) 5 MG immediate release tablet Take 1 tablet (5 mg total) by mouth every 8 (eight) hours as needed. 08/18/23 08/17/24  Delton Prairie, MD  oxyCODONE-acetaminophen (PERCOCET/ROXICET) 5-325 MG tablet Take 1 tablet by mouth every 4 (four) hours as needed for severe pain. 07/15/23   Irean Hong, MD     Allergies  Banana, Charentais melon (french melon), Ketorolac, Nsaids, and Tramadol   Family History  No family history on file.   Physical Exam  Triage Vital Signs: ED Triage Vitals  Encounter Vitals Group      BP 09/18/23 2157 (!) 130/113     Systolic BP Percentile --      Diastolic BP Percentile --      Pulse Rate 09/18/23 2157 92     Resp 09/18/23 2157 18     Temp 09/18/23 2157 98.4 F (36.9 C)     Temp src --      SpO2 09/18/23 2157 97 %     Weight 09/18/23 2158 210 lb (95.3 kg)     Height 09/18/23 2158 5\' 9"  (1.753 m)     Head Circumference --      Peak Flow --      Pain Score 09/18/23 2158 10     Pain Loc --      Pain Education --      Exclude from Growth Chart --     Updated Vital Signs: BP (!) 130/113 (BP Location: Left Arm)   Pulse 92   Temp 98.4 F (36.9 C)   Resp 18   Ht 5\' 9"  (1.753 m)   Wt 95.3 kg   SpO2 97%   BMI 31.01 kg/m    General: Awake, no distress.  CV:  Good peripheral perfusion.  Resp:  Normal effort.  Abd:  No distention.  Other:  Entire bottom row of teeth are decayed, some are broken and missing.  Edentulous top row of teeth.   ED Results /  Procedures / Treatments  Labs (all labs ordered are listed, but only abnormal results are displayed) Labs Reviewed - No data to display   EKG  None   RADIOLOGY None   Official radiology report(s): No results found.   PROCEDURES:  Critical Care performed: No  Procedures   MEDICATIONS ORDERED IN ED: Medications  amoxicillin (AMOXIL) capsule 500 mg (500 mg Oral Given 09/19/23 0009)  lidocaine (XYLOCAINE) 2 % viscous mouth solution 15 mL (15 mLs Mouth/Throat Given 09/19/23 0009)  HYDROcodone-acetaminophen (NORCO/VICODIN) 5-325 MG per tablet 1 tablet (1 tablet Oral Given 09/19/23 0009)     IMPRESSION / MDM / ASSESSMENT AND PLAN / ED COURSE  I reviewed the triage vital signs and the nursing notes.                             42 year old male presenting with dentalgia secondary to dental caries.  Will place on amoxicillin, analgesia.  Patient will follow-up with his maxillofacial surgeon for extraction.  Strict return precautions given.  Patient verbalizes understanding and agrees with plan  of care.  Patient's presentation is most consistent with acute, uncomplicated illness.   FINAL CLINICAL IMPRESSION(S) / ED DIAGNOSES   Final diagnoses:  Pain due to dental caries     Rx / DC Orders   ED Discharge Orders          Ordered    amoxicillin (AMOXIL) 500 MG capsule  3 times daily        09/18/23 2358    HYDROcodone-acetaminophen (NORCO) 5-325 MG tablet  Every 6 hours PRN        09/18/23 2358             Note:  This document was prepared using Dragon voice recognition software and may include unintentional dictation errors.   Irean Hong, MD 09/19/23 [phone]

## 2023-09-18 NOTE — Discharge Instructions (Signed)
1. Take antibiotic as prescribed (Amoxicillin 500mg  three times daily x 7 days). 2. Take pain medicine as needed (Norco #15). 3. Return to the ER for worsening symptoms, persistent vomiting, fever, difficulty breathing or other concerns.

## 2023-10-28 ENCOUNTER — Emergency Department
Admission: EM | Admit: 2023-10-28 | Discharge: 2023-10-28 | Disposition: A | Discharge status: Home / Self Care | Payer: Self-pay | Attending: Emergency Medicine | Admitting: Emergency Medicine

## 2023-10-28 ENCOUNTER — Emergency Department: Payer: Self-pay

## 2023-10-28 ENCOUNTER — Other Ambulatory Visit: Payer: Self-pay

## 2023-10-28 DIAGNOSIS — S92345A Nondisplaced fracture of fourth metatarsal bone, left foot, initial encounter for closed fracture: Secondary | ICD-10-CM | POA: Insufficient documentation

## 2023-10-28 DIAGNOSIS — M7989 Other specified soft tissue disorders: Secondary | ICD-10-CM | POA: Insufficient documentation

## 2023-10-28 DIAGNOSIS — X58XXXA Exposure to other specified factors, initial encounter: Secondary | ICD-10-CM | POA: Insufficient documentation

## 2023-10-28 MED ORDER — IBUPROFEN 800 MG PO TABS: 800.0000 mg | ORAL_TABLET | Freq: Three times a day (TID) | ORAL | 0 refills | Status: DC | PRN

## 2023-10-28 MED ORDER — HYDROCODONE-ACETAMINOPHEN 5-325 MG PO TABS
2.0000 | ORAL_TABLET | Freq: Once | ORAL | Status: AC
  Administered 2023-10-28: 2 via ORAL
  Filled 2023-10-28: qty 2

## 2023-10-28 NOTE — ED Triage Notes (Addendum)
Pt to ed from home via POV for left foot pain x 3 weeks. Pt works 15+ hours a day on his feet. Pt denies any wounds on his foot but states it is swollen and he can't get it in a shoe. Pt is CAOx4, in no acute distress and ambulatory in triage. Pt left foot doesn't appear to be swollen. Pt points to left lateral side of his foot where it hurts and no trauma or injury to foot. No redness.

## 2023-10-28 NOTE — ED Provider Notes (Incomplete)
[facility] Provider Note    Event Date/Time   First MD Initiated Contact with Patient 10/28/23 2259     (approximate)   History   Foot Injury (left)   HPI  Trevor Burgess is a 42 y.o. male with history of Crohn's disease who presents to the emergency department with complaints of left foot pain that has been ongoing for 3 weeks.  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  He denies any known injury.  Pain is mostly over the lateral foot with mild soft tissue swelling.   History provided by patient.    History reviewed. No pertinent past medical history.  Past Surgical History:  Procedure Laterality Date   KNEE SURGERY Left    WISDOM TOOTH EXTRACTION      MEDICATIONS:  Prior to Admission medications   Medication Sig Start Date End Date Taking? Authorizing Provider  amoxicillin (AMOXIL) 500 MG capsule Take 1 capsule (500 mg total) by mouth 3 (three) times daily. 09/18/23   Irean Hong, MD  HYDROcodone-acetaminophen (NORCO) 5-325 MG tablet Take 1 tablet by mouth every 6 (six) hours as needed for moderate pain. 09/18/23   Irean Hong, MD  oxyCODONE (ROXICODONE) 5 MG immediate release tablet Take 1 tablet (5 mg total) by mouth every 8 (eight) hours as needed. 07/21/23 07/20/24  Delton Prairie, MD  oxyCODONE (ROXICODONE) 5 MG immediate release tablet Take 1 tablet (5 mg total) by mouth every 8 (eight) hours as needed. 08/18/23 08/17/24  Delton Prairie, MD  oxyCODONE-acetaminophen (PERCOCET/ROXICET) 5-325 MG tablet Take 1 tablet by mouth every 4 (four) hours as needed for severe pain. 07/15/23   Irean Hong, MD    Physical Exam   Triage Vital Signs: ED Triage Vitals  Encounter Vitals Group     BP 10/28/23 2204 (!) 158/122     Systolic BP Percentile --      Diastolic BP Percentile --      Pulse Rate 10/28/23 2204 (!) 107     Resp 10/28/23 2204 16     Temp 10/28/23 2204 98.3 F (36.8 C)     Temp Source 10/28/23 2204 Oral     SpO2  10/28/23 2305 99 %     Weight --      Height 10/28/23 2203 5\' 9"  (1.753 m)     Head Circumference --      Peak Flow --      Pain Score 10/28/23 2203 7     Pain Loc --      Pain Education --      Exclude from Growth Chart --     Most recent vital signs: Vitals:   10/28/23 2204 10/28/23 2305  BP: (!) 158/122 (!) 147/105  Pulse: (!) 107 (!) 103  Resp: 16 20  Temp: 98.3 F (36.8 C)   SpO2:  99%     CONSTITUTIONAL: Alert and responds appropriately to questions. Well-appearing; well-nourished HEAD: Normocephalic, atraumatic EYES: Conjunctivae clear, pupils appear equal ENT: normal nose; moist mucous membranes NECK: Normal range of motion CARD: Regular rate and rhythm RESP: Normal chest excursion without splinting or tachypnea; no hypoxia or respiratory distress, speaking full sentences ABD/GI: non-distended EXT: Patient is tender to palpation over the lateral left foot with minimal soft tissue swelling.  No overlying ecchymosis.  2+ left DP pulse.  No calf tenderness or calf swelling noted.  Compartments soft.  Normal capillary refill.  No tenderness over the  ankle. SKIN: Normal color for age and race, no rashes on exposed skin NEURO: Moves all extremities equally, normal speech, no facial asymmetry noted PSYCH: The patient's mood and manner are appropriate. Grooming and personal hygiene are appropriate.  ED Results / Procedures / Treatments   LABS: (all labs ordered are listed, but only abnormal results are displayed) Labs Reviewed - No data to display   EKG:  RADIOLOGY: My personal review and interpretation of imaging: Patient has an acute nondisplaced fracture of the fourth metatarsal.  I have personally reviewed all radiology reports. DG Foot Complete Left  Result Date: 10/28/2023 CLINICAL DATA:  Left foot pain for 3 weeks. Pain is in the lateral side of the foot. Swelling. EXAM: LEFT FOOT - COMPLETE 3+ VIEW COMPARISON:  None Available. FINDINGS: Acute nondisplaced  fracture of the distal diaphysis of the fourth metatarsal. Adjacent soft tissue swelling. IMPRESSION: Acute nondisplaced fracture of the fourth metatarsal. Electronically Signed   By: Minerva Fester M.D.   On: 10/28/2023 22:40     PROCEDURES:  Critical Care performed: No     Procedures    IMPRESSION / MDM / ASSESSMENT AND PLAN / ED COURSE  I reviewed the triage vital signs and the nursing notes.   Patient here with left foot pain.  Ongoing for 3 weeks.  No injury.     DIFFERENTIAL DIAGNOSIS (includes but not limited to):   Tendinitis, contusion, sprain, fracture.  No sign of compartment syndrome, gout, septic arthritis, cellulitis.  Patient's presentation is most consistent with acute complicated illness / injury requiring diagnostic workup.  PLAN: X-ray obtained from triage reviewed and interpreted by myself and the radiologist and shows an acute nondisplaced fourth metatarsal fracture.  Will place him in a walking boot and give him crutches to use as needed.  Provided with work note for couple of days.  Discussed with patient that he can weight-bear as tolerated.  Will give orthopedic follow-up as an outpatient.  Will provide with pain medication here.  On review of patient's records, it appears he has a history of substance abuse, opiate withdrawal and has been seen at [facility] for this previously.  On review of his records from the [place] controlled substance reporting system and it appears he has received multiple controlled substances in the past 4 months including prescription for oxycodone 4 days ago.  Recommended Tylenol, ibuprofen over-the-counter.  Patient states he cannot take ibuprofen because of his Crohn's disease.  Recommended that he take his oxycodone that was prescribed 4 days ago.  Will give orthopedic follow-up information.   MEDICATIONS GIVEN IN ED: Medications  HYDROcodone-acetaminophen (NORCO/VICODIN) 5-325 MG per tablet 2 tablet (2 tablets Oral  Given 10/28/23 2333)     ED COURSE:  At this time, I do not feel there is any life-threatening condition present. I reviewed all nursing notes, vitals, pertinent previous records.  All lab and urine results, EKGs, imaging ordered have been independently reviewed and interpreted by myself.  I reviewed all available radiology reports from any imaging ordered this visit.  Based on my assessment, I feel the patient is safe to be discharged home without further emergent workup and can continue workup as an outpatient as needed. Discussed all findings, treatment plan as well as usual and customary return precautions.  They verbalize understanding and are comfortable with this plan.  Outpatient follow-up has been provided as needed.  All questions have been answered.    CONSULTS:  none   OUTSIDE RECORDS REVIEWED:  12/23/2022 10/24/2023  1 Oxycodone-Acetaminophen 5-325 18.00 3 Pa Geu [id number] Wal ([id number]) 0/0 45.00 MME Private Pay [place]  09/19/2023 09/19/2023  1 Hydrocodone-Acetamin 5-325 Mg 15.00 3 Ja Sun [id number] Wal ([id number]) 0/0 25.00 MME Private Pay [place]  08/18/2023 08/18/2023  1 Oxycodone Hcl (Ir) 5 Mg Tablet 8.00 2 Dy Smi [id number] Wal ([id number]) 0/0 30.00 MME Comm Ins [place]  08/11/2023 08/11/2023  1 Hydrocodone-Acetamin 5-325 Mg 6.00 2 Je Bau [id number] Wal ([id number]) 0/0 15.00 MME Medicaid [place]  07/21/2023 07/21/2023  2 Oxycodone Hcl (Ir) 5 Mg Tablet 6.00 2 Dy Smi [id number] Nor ([id number]) 0/0 22.50 MME Medicaid [place]  07/19/2023 07/19/2023  2 Lorazepam 1 Mg Tablet 12.00 6 My Har [id number] Nor ([id number]) 0/0 2.00 LME Medicaid [place]  07/15/2023 07/15/2023  2 Oxycodone-Acetaminophen 5-325 15.00 3 Ja Sun [id number] Nor ([id number]) 0/0 37.50 MME Medicaid [place]  07/09/2023 07/09/2023  1 Hydrocodone-Acetamin 5-325 Mg 6.00 3 Le Eav [id number] Wal ([id number]) 0/0 10.00 MME Medicaid [place]       FINAL CLINICAL IMPRESSION(S) / ED DIAGNOSES   Final diagnoses:  Nondisplaced fracture of fourth metatarsal bone, left foot, initial encounter for closed fracture     Rx /  DC Orders   ED Discharge Orders          Ordered    ibuprofen (ADVIL) 800 MG tablet  Every 8 hours PRN        10/28/23 2331             Note:  This document was prepared using Dragon voice recognition software and may include unintentional dictation errors.   Deaun Rocha, Layla Maw, DO 10/29/23 0016    Lashawnda Hancox, Layla Maw, DO 10/29/23 3220

## 2023-10-28 NOTE — Discharge Instructions (Addendum)
Please take your medication that was prescribed 4 days ago.  Please follow-up with orthopedic surgery.

## 2024-02-19 ENCOUNTER — Emergency Department

## 2024-02-19 ENCOUNTER — Other Ambulatory Visit: Payer: Self-pay

## 2024-02-19 ENCOUNTER — Emergency Department
Admission: EM | Admit: 2024-02-19 | Discharge: 2024-02-19 | Disposition: A | Discharge status: Home / Self Care | Attending: Emergency Medicine | Admitting: Emergency Medicine

## 2024-02-19 DIAGNOSIS — H519 Unspecified disorder of binocular movement: Secondary | ICD-10-CM | POA: Insufficient documentation

## 2024-02-19 DIAGNOSIS — G2401 Drug induced subacute dyskinesia: Secondary | ICD-10-CM | POA: Insufficient documentation

## 2024-02-19 DIAGNOSIS — R479 Unspecified speech disturbances: Secondary | ICD-10-CM | POA: Insufficient documentation

## 2024-02-19 DIAGNOSIS — F419 Anxiety disorder, unspecified: Secondary | ICD-10-CM

## 2024-02-19 DIAGNOSIS — H518 Other specified disorders of binocular movement: Secondary | ICD-10-CM

## 2024-02-19 LAB — DIFFERENTIAL
Abs Immature Granulocytes: 0.03 10*3/uL (ref 0.00–0.07)
Basophils Absolute: 0.1 10*3/uL (ref 0.0–0.1)
Basophils Relative: 1 %
Eosinophils Absolute: 0.1 10*3/uL (ref 0.0–0.5)
Eosinophils Relative: 1 %
Immature Granulocytes: 0 %
Lymphocytes Relative: 17 %
Lymphs Abs: 1.7 10*3/uL (ref 0.7–4.0)
Monocytes Absolute: 0.7 10*3/uL (ref 0.1–1.0)
Monocytes Relative: 7 %
Neutro Abs: 7.3 10*3/uL (ref 1.7–7.7)
Neutrophils Relative %: 74 %

## 2024-02-19 LAB — URINE DRUG SCREEN, QUALITATIVE (ARMC ONLY)
Amphetamines, Ur Screen: POSITIVE — AB
Barbiturates, Ur Screen: NOT DETECTED
Benzodiazepine, Ur Scrn: POSITIVE — AB
Cannabinoid 50 Ng, Ur ~~LOC~~: POSITIVE — AB
Cocaine Metabolite,Ur ~~LOC~~: NOT DETECTED
MDMA (Ecstasy)Ur Screen: NOT DETECTED
Methadone Scn, Ur: NOT DETECTED
Opiate, Ur Screen: NOT DETECTED
Phencyclidine (PCP) Ur S: NOT DETECTED
Tricyclic, Ur Screen: NOT DETECTED

## 2024-02-19 LAB — CBC
HCT: 41.4 % (ref 39.0–52.0)
Hemoglobin: 14.3 g/dL (ref 13.0–17.0)
MCH: 28.6 pg (ref 26.0–34.0)
MCHC: 34.5 g/dL (ref 30.0–36.0)
MCV: 82.8 fL (ref 80.0–100.0)
Platelets: 310 10*3/uL (ref 150–400)
RBC: 5 MIL/uL (ref 4.22–5.81)
RDW: 12.3 % (ref 11.5–15.5)
WBC: 9.8 10*3/uL (ref 4.0–10.5)
nRBC: 0 % (ref 0.0–0.2)

## 2024-02-19 LAB — URINALYSIS, COMPLETE (UACMP) WITH MICROSCOPIC
Bacteria, UA: NONE SEEN
Bilirubin Urine: NEGATIVE
Glucose, UA: NEGATIVE mg/dL
Hgb urine dipstick: NEGATIVE
Ketones, ur: NEGATIVE mg/dL
Leukocytes,Ua: NEGATIVE
Nitrite: NEGATIVE
Protein, ur: 30 mg/dL — AB
Specific Gravity, Urine: 1.017 (ref 1.005–1.030)
pH: 6 (ref 5.0–8.0)

## 2024-02-19 LAB — COMPREHENSIVE METABOLIC PANEL
ALT: 22 U/L (ref 0–44)
AST: 23 U/L (ref 15–41)
Albumin: 4.7 g/dL (ref 3.5–5.0)
Alkaline Phosphatase: 58 U/L (ref 38–126)
Anion gap: 7 (ref 5–15)
BUN: 14 mg/dL (ref 6–20)
CO2: 27 mmol/L (ref 22–32)
Calcium: 9.2 mg/dL (ref 8.9–10.3)
Chloride: 102 mmol/L (ref 98–111)
Creatinine, Ser: 1.02 mg/dL (ref 0.61–1.24)
GFR, Estimated: >60 mL/min (ref >60)
Glucose, Bld: 113 mg/dL — ABNORMAL HIGH (ref 70–99)
Potassium: 4 mmol/L (ref 3.5–5.1)
Sodium: 136 mmol/L (ref 135–145)
Total Bilirubin: 0.9 mg/dL (ref 0.0–1.2)
Total Protein: 8 g/dL (ref 6.5–8.1)

## 2024-02-19 LAB — PROTIME-INR
INR: 1.1 (ref 0.8–1.2)
Prothrombin Time: 14.4 s (ref 11.4–15.2)

## 2024-02-19 LAB — ETHANOL: Alcohol, Ethyl (B): <10 mg/dL (ref <10)

## 2024-02-19 LAB — APTT: aPTT: 31 s (ref 24–36)

## 2024-02-19 LAB — CBG MONITORING, ED: Glucose-Capillary: 132 mg/dL — ABNORMAL HIGH (ref 70–99)

## 2024-02-19 MED ORDER — GADOBUTROL 1 MMOL/ML IV SOLN
7.5000 mL | Freq: Once | INTRAVENOUS | Status: AC | PRN
  Administered 2024-02-19: 7.5 mL via INTRAVENOUS

## 2024-02-19 MED ORDER — HYDROXYZINE HCL 25 MG PO TABS
50.0000 mg | ORAL_TABLET | Freq: Once | ORAL | Status: AC
  Administered 2024-02-19: 50 mg via ORAL
  Filled 2024-02-19: qty 2

## 2024-02-19 MED ORDER — SODIUM CHLORIDE 0.9% FLUSH
3.0000 mL | Freq: Once | INTRAVENOUS | Status: AC
  Administered 2024-02-19: 3 mL via INTRAVENOUS

## 2024-02-19 MED ORDER — ACETAMINOPHEN 500 MG PO TABS
1000.0000 mg | ORAL_TABLET | Freq: Once | ORAL | Status: AC
  Administered 2024-02-19: 1000 mg via ORAL
  Filled 2024-02-19: qty 2

## 2024-02-19 MED ORDER — LORAZEPAM 2 MG/ML IJ SOLN: 1.0000 mg | Freq: Once | INTRAMUSCULAR | Status: DC

## 2024-02-19 NOTE — ED Notes (Signed)
 Currently in CT with  neurologist assessing patient at bedside. Patient went to bed at 0100 this morning and when he woke up at 0500 this morning patient states he felt off and his condition was getting worse.

## 2024-02-19 NOTE — Progress Notes (Signed)
   02/19/24 1230  Spiritual Encounters  Type of Visit Initial  Care provided to: Patient;Friend  Conversation partners present during encounter Nurse  Referral source Code page  Reason for visit Code  OnCall Visit Yes  Intervention Outcomes  Outcomes Declined chaplain visit  Spiritual Care Plan  Spiritual Care Issues Still Outstanding No further spiritual care needs at this time (see row info)

## 2024-02-19 NOTE — Progress Notes (Signed)
 CODE STROKE- PHARMACY COMMUNICATION   Time CODE STROKE called/page received:3/16 @ 1226  Time response to CODE STROKE was made (in person or via phone): in-person  Time Stroke Kit retrieved from [facility] (only if needed): No interventions   Name of Provider/Nurse contacted: Dr. Iver Nestle   No past medical history on file. Prior to Admission medications   Medication Sig Start Date End Date Taking? Authorizing Provider  HYDROcodone-acetaminophen (NORCO) 5-325 MG tablet Take 1 tablet by mouth every 6 (six) hours as needed for moderate pain. 09/18/23   Irean Hong, MD  ibuprofen (ADVIL) 800 MG tablet Take 1 tablet (800 mg total) by mouth every 8 (eight) hours as needed. 10/28/23   Ward, Layla Maw, DO  oxyCODONE-acetaminophen (PERCOCET/ROXICET) 5-325 MG tablet Take 1 tablet by mouth every 4 (four) hours as needed for severe pain. 07/15/23   Irean Hong, MD  amoxicillin (AMOXIL) 500 MG capsule Take 1 capsule (500 mg total) by mouth 3 (three) times daily. 09/18/23 02/19/24  Irean Hong, MD  oxyCODONE (ROXICODONE) 5 MG immediate release tablet Take 1 tablet (5 mg total) by mouth every 8 (eight) hours as needed. 07/21/23 02/19/24  Delton Prairie, MD  oxyCODONE (ROXICODONE) 5 MG immediate release tablet Take 1 tablet (5 mg total) by mouth every 8 (eight) hours as needed. 08/18/23 02/19/24  Delton Prairie, MD   Effie Shy, PharmD Pharmacy Resident  02/19/2024 1:02 PM

## 2024-02-19 NOTE — ED Provider Notes (Signed)
-----------------------------------------   4:29 PM on 02/19/2024 -----------------------------------------  I took over care of this patient from Dr. Lenard Lance.  MRI brain is negative for acute findings.  Radiology impression is as follows:  IMPRESSION:  1. Mildly motion degraded exam.  2. No evidence of an acute intracranial abnormality.  3. There are a few small nonspecific chronic insults within the  cerebral white matter.  4. 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  5. 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  6. Otherwise unremarkable MRI appearance the brain.  7. Minor paranasal sinus mucosal thickening.    The patient reported some recurrence of his symptoms although they have now once again resolved.  Dr. Iver Nestle from neurology reassessed the patient after the MRI and advised that there is no further neurological workup or treatment indicated.  She recommends that he discontinue the Adderall.  I went to reassess the patient myself and had an extensive discussion with him.  At that time the patient did not have any speech disturbance, abnormal eye movements, or other acute symptoms.  His heart rate was between 100-110.  The patient had several questions about the possible causes of his symptoms.  I reiterated the assessments of the prior providers, specifically the concern for possible tardive dyskinesia related to Adderall versus acute anxiety.  I attempted to reassure the patient about his negative workup.  I answered all of his questions to the best my ability.  I also explained that in the ED our role is to rule out acute and life-threatening etiologies of his symptoms, but that a definitive diagnosis cannot always be determined on the initial ED visit.  The patient requested that I give something else for anxiety in the ED.  I explained that given his history of benzodiazepine abuse, I did not feel comfortable giving  benzodiazepines as his symptoms could worsen once they wear off.  The patient then became very concerned about where the history of benzodiazepine abuse was documented.  I ended up demonstrating to him that it has been documented on multiple prior charts, including the first note that I opened, from an ED visit to [facility] in 2022.  The patient also stated he didn't want to "pit people against each other" but repeatedly criticized Dr. Rollene Fare assessment and discussion with him.  I expressed understanding that he was frustrated and again attempted to reassure him.  Although thanking me for talking to him, he became what I perceived as progressively more and more confrontational and argumentative throughout my discussion with him, and then abruptly asked for his discharge paperwork.  Patient is stable for discharge at this time.  I gave him strict return precautions and he expressed understanding.  I have provided referral information for two area primary care practices.   Dionne Bucy, MD 02/19/24 [phone]

## 2024-02-19 NOTE — ED Notes (Signed)
 Pt very upset about his stay today, stating "I came in with all these symptoms and I am leaving with more questions than I started with. The ER dr's were great but the neuro dr was very condescending.I should've just gone to [facility], no wonder this hospital only gets 2 stars."

## 2024-02-19 NOTE — ED Notes (Signed)
 EDP at bedside
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
## 2024-02-19 NOTE — Progress Notes (Signed)
 Telestroke cart was activated at 1225. Per treatment team, pt's LKW was at 0830 with c/o dysarthria. Dr. Lenard Lance, EDP assessed pt prior to cart activation. Pt transported to CT prior to cart activation and returned to room after cart was disconnected. Dr. Iver Nestle, Neurologist, was paged for code stroke at 1227. Dr. Iver Nestle, at bedside at 1231 to assess the patient. LKW was confirmed to be 0100 per Dr. Iver Nestle. Based on Dr. Rollene Fare assessment, pt does not meet criteria for emergent interventions at this time 2/2 onset known outside of window. Dr. Iver Nestle to f/u with EDP regarding recommendations. No further needs from Telestroke RN at this time. Telestroke cart disconnected at 1240.

## 2024-02-19 NOTE — ED Notes (Signed)
 LKW 0100AM 02/19/2024. Established by neurologist at bedside in CT.

## 2024-02-19 NOTE — ED Notes (Signed)
 Patient arriving to CT at this time

## 2024-02-19 NOTE — ED Triage Notes (Signed)
 Pt to ED for twitching to both eyes, arms and legs that started this AM. Pt sounds dysarthric but denies his speech sounding different to himself. Difficult to determine LKW. Pt stating woke up this morning feeling off. Unsure of onset but states he noticed this morning that he could not think of the words he needed to say.  Upon talking to pt, LKW with word finding was 0830.  CBG  BP 179/123  No facial droop noted.
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
## 2024-02-19 NOTE — Discharge Instructions (Signed)
 Your workup in the ED was reassuring.  There are no signs of a stroke or TIA.  There is concern that you may be having tardive dyskinesia or other a temporary reaction to medication, or possible anxiety.  Do not resume the Adderall.  Should continue to avoid alcohol or drugs.  Follow-up with a primary care provider.  We have given you contact information for two of our area practices.  Return to the ER for new, worsening, recurrent speech disturbance, eye or vision problems, any weakness or numbness, or any other new or worsening symptoms that concern you.

## 2024-02-21 NOTE — ED Notes (Signed)
 Pt still having some right side facial droop wit some speech impairment. Visitor and pt stating speech has improved.

## 2024-02-21 NOTE — ED Notes (Signed)
 Pt stating he was seeing spots moving around while in MRI. Dr Marisa Severin made aware. Dr Marisa Severin stated he would check on pt.

## 2024-02-21 NOTE — ED Notes (Signed)
Dr. Siadecki at bedside 

## 2024-02-23 ENCOUNTER — Ambulatory Visit: Payer: Self-pay | Admitting: Psychiatry

## 2024-02-23 ENCOUNTER — Telehealth: Payer: Self-pay | Admitting: Psychiatry

## 2024-02-23 ENCOUNTER — Encounter: Payer: Self-pay | Admitting: Psychiatry

## 2024-02-23 ENCOUNTER — Other Ambulatory Visit: Payer: Self-pay

## 2024-02-23 VITALS — BP 150/100 | HR 65 | Temp 98.0°F | Ht 69.0 in | Wt 189.4 lb

## 2024-02-23 DIAGNOSIS — F909 Attention-deficit hyperactivity disorder, unspecified type: Secondary | ICD-10-CM | POA: Diagnosis not present
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
## 2024-02-23 NOTE — Telephone Encounter (Signed)
 PT declined to make a 3 month follow up due to needing to wait for medical records to come in.

## 2024-03-06 NOTE — Telephone Encounter (Signed)
 Called the patient back at this time and got voicemail, voicemail left to call back the office.

## 2024-03-08 ENCOUNTER — Telehealth: Payer: Self-pay | Admitting: Psychiatry

## 2024-03-08 NOTE — Telephone Encounter (Signed)
 At this time, after extensive chart review. Pt was called an notified that there was no supporting documentation of testing as he describes.  Through chart review only 2 charts were visible, which includes 12/03/2014 and 04/02/2015 with no supporting documentation of neuropsych testing.  Pt was educated on the importance of getting neuropsych testing should he want stimulants.  Pt is noted with extensive ED visits with no other mention of ADHD and patient was advised of this.  Pt was upset, and states "I don't understand you all have the test, it was done".  Pt was explained the test is not documented and I will need new neuropsych testing.  Pt was provided previous referral for psych testing, which he is in agreement.  Pt encouraged to call back when testing is completed. I spent 30 min on this encounter.

## 2024-03-08 NOTE — Telephone Encounter (Signed)
 Return call to the patient, patient reports that he returned 3 request of information forms to the front desk.  When he called [facility] and from records of 2016 2050 he had notified that they were under the epic system in which I was able to pull up to chart in which I am currently reviewing the chart.  Patient was told I will follow-up with him once the charts are downloaded and reviewed stating that I will look for the proper testing that is required for ADHD diagnosis.  Patient understands and requests to be called back after 1 PM.  Patient currently satisfied with provider plan and has no questions at this time.
